# Patient Record
Sex: Female | Born: 1965 | Race: White | Hispanic: No | Marital: Married
Health system: Southern US, Community
[De-identification: ages and names within clinical notes are randomized; demographics above are authoritative.]

---

## 1998-01-01 ENCOUNTER — Other Ambulatory Visit: Admission: RE | Admit: 1998-01-01 | Discharge: 1998-01-01 | Payer: Self-pay | Admitting: Obstetrics and Gynecology

## 1999-01-03 ENCOUNTER — Ambulatory Visit (HOSPITAL_COMMUNITY): Admission: RE | Admit: 1999-01-03 | Discharge: 1999-01-03 | Payer: Self-pay | Admitting: Obstetrics and Gynecology

## 1999-01-13 ENCOUNTER — Other Ambulatory Visit: Admission: RE | Admit: 1999-01-13 | Discharge: 1999-01-13 | Payer: Self-pay | Admitting: Obstetrics and Gynecology

## 1999-11-24 ENCOUNTER — Other Ambulatory Visit: Admission: RE | Admit: 1999-11-24 | Discharge: 1999-11-24 | Payer: Self-pay | Admitting: Obstetrics and Gynecology

## 2000-09-28 ENCOUNTER — Other Ambulatory Visit: Admission: RE | Admit: 2000-09-28 | Discharge: 2000-09-28 | Payer: Self-pay | Admitting: Obstetrics and Gynecology

## 2001-02-02 ENCOUNTER — Other Ambulatory Visit: Admission: RE | Admit: 2001-02-02 | Discharge: 2001-02-02 | Payer: Self-pay | Admitting: Obstetrics and Gynecology

## 2002-05-28 ENCOUNTER — Other Ambulatory Visit: Admission: RE | Admit: 2002-05-28 | Discharge: 2002-05-28 | Payer: Self-pay | Admitting: Obstetrics and Gynecology

## 2003-06-04 ENCOUNTER — Other Ambulatory Visit: Admission: RE | Admit: 2003-06-04 | Discharge: 2003-06-04 | Payer: Self-pay | Admitting: Obstetrics and Gynecology

## 2004-08-28 ENCOUNTER — Other Ambulatory Visit: Admission: RE | Admit: 2004-08-28 | Discharge: 2004-08-28 | Payer: Self-pay | Admitting: Obstetrics and Gynecology

## 2013-07-31 ENCOUNTER — Other Ambulatory Visit: Payer: Self-pay | Admitting: Obstetrics and Gynecology

## 2013-07-31 DIAGNOSIS — R928 Other abnormal and inconclusive findings on diagnostic imaging of breast: Secondary | ICD-10-CM

## 2013-08-10 ENCOUNTER — Other Ambulatory Visit: Payer: Self-pay

## 2013-08-30 ENCOUNTER — Ambulatory Visit
Admission: RE | Admit: 2013-08-30 | Discharge: 2013-08-30 | Disposition: A | Discharge status: Home / Self Care | Payer: BC Managed Care – PPO | Source: Ambulatory Visit | Attending: Obstetrics and Gynecology | Admitting: Obstetrics and Gynecology

## 2013-08-30 DIAGNOSIS — R928 Other abnormal and inconclusive findings on diagnostic imaging of breast: Secondary | ICD-10-CM

## 2014-11-12 ENCOUNTER — Other Ambulatory Visit: Payer: Self-pay | Admitting: Obstetrics and Gynecology

## 2014-11-12 DIAGNOSIS — R928 Other abnormal and inconclusive findings on diagnostic imaging of breast: Secondary | ICD-10-CM

## 2014-11-18 ENCOUNTER — Other Ambulatory Visit: Payer: Self-pay | Admitting: Obstetrics and Gynecology

## 2014-11-18 ENCOUNTER — Ambulatory Visit
Admission: RE | Admit: 2014-11-18 | Discharge: 2014-11-18 | Disposition: A | Discharge status: Home / Self Care | Payer: BC Managed Care – PPO | Source: Ambulatory Visit | Attending: Obstetrics and Gynecology | Admitting: Obstetrics and Gynecology

## 2014-11-18 DIAGNOSIS — R928 Other abnormal and inconclusive findings on diagnostic imaging of breast: Secondary | ICD-10-CM

## 2014-11-26 ENCOUNTER — Ambulatory Visit
Admission: RE | Admit: 2014-11-26 | Discharge: 2014-11-26 | Disposition: A | Discharge status: Home / Self Care | Payer: BC Managed Care – PPO | Source: Ambulatory Visit | Attending: Obstetrics and Gynecology | Admitting: Obstetrics and Gynecology

## 2014-11-26 ENCOUNTER — Other Ambulatory Visit: Payer: Self-pay | Admitting: Obstetrics and Gynecology

## 2014-11-26 DIAGNOSIS — R928 Other abnormal and inconclusive findings on diagnostic imaging of breast: Secondary | ICD-10-CM

## 2014-11-26 DIAGNOSIS — R921 Mammographic calcification found on diagnostic imaging of breast: Secondary | ICD-10-CM

## 2014-11-26 HISTORY — PX: BREAST BIOPSY: SHX20

## 2021-07-09 ENCOUNTER — Other Ambulatory Visit: Payer: Self-pay | Admitting: Obstetrics and Gynecology

## 2021-07-09 DIAGNOSIS — N6320 Unspecified lump in the left breast, unspecified quadrant: Secondary | ICD-10-CM

## 2021-07-29 ENCOUNTER — Ambulatory Visit
Admission: RE | Admit: 2021-07-29 | Discharge: 2021-07-29 | Disposition: A | Discharge status: Home / Self Care | Payer: 59 | Source: Ambulatory Visit | Attending: Obstetrics and Gynecology | Admitting: Obstetrics and Gynecology

## 2021-07-29 DIAGNOSIS — N6320 Unspecified lump in the left breast, unspecified quadrant: Secondary | ICD-10-CM

## 2023-01-11 IMAGING — MG DIGITAL DIAGNOSTIC BILAT W/ TOMO W/ CAD
6 of 10 series · 6 of 30 positions shown · non-contrast
Comparison: Previous exam(s).

CLINICAL DATA: Palpable lump within the upper LEFT breast. History
of benign RIGHT breast stereotactic biopsy in 3851 with pathology
result of benign fibrocystic change and pseudoangiomatous stromal
hyperplasia.

EXAM:
DIGITAL DIAGNOSTIC BILATERAL MAMMOGRAM WITH TOMOSYNTHESIS AND CAD;
ULTRASOUND LEFT BREAST LIMITED
TECHNIQUE: Bilateral digital diagnostic mammography and breast tomosynthesis
was performed. The images were evaluated with computer-aided
detection.; Targeted ultrasound examination of the left breast was
performed.

[L TAN synth-2D]
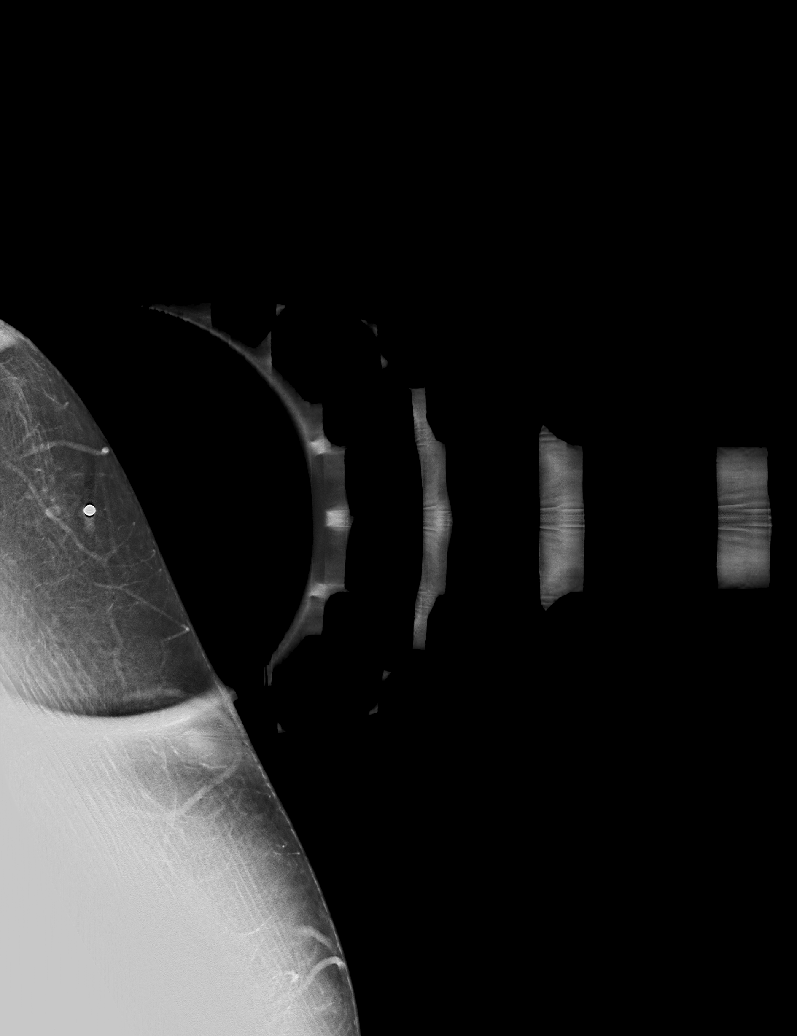

[L MLO synth-2D]
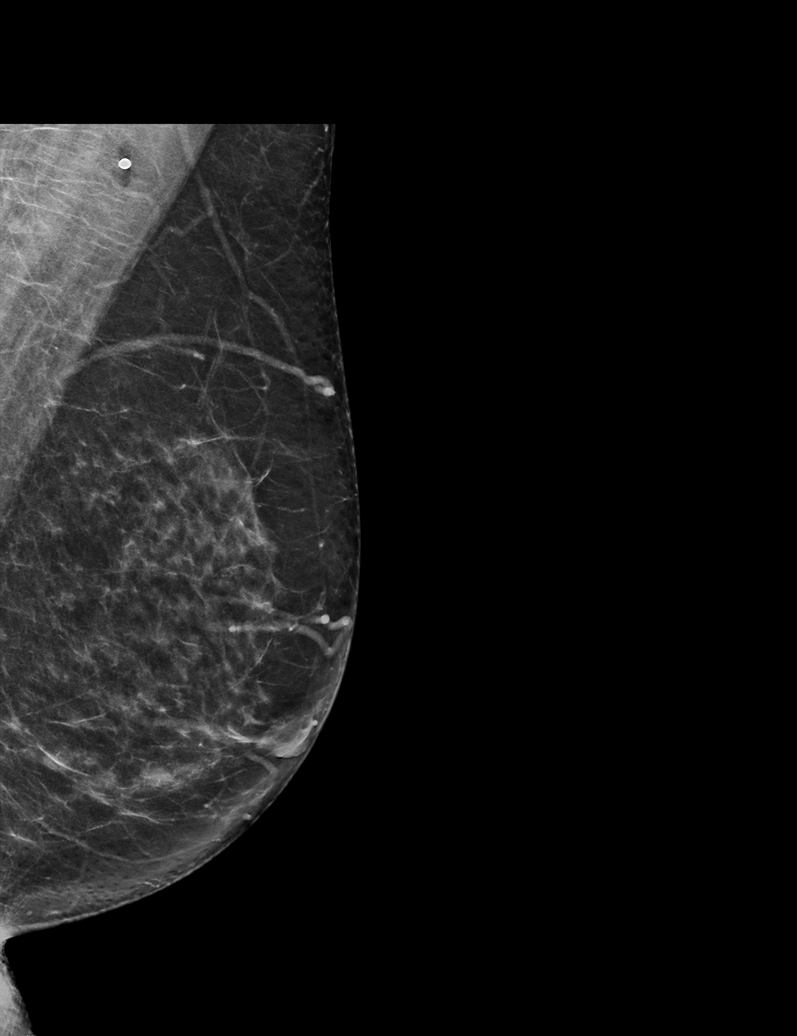

[R MLO synth-2D]
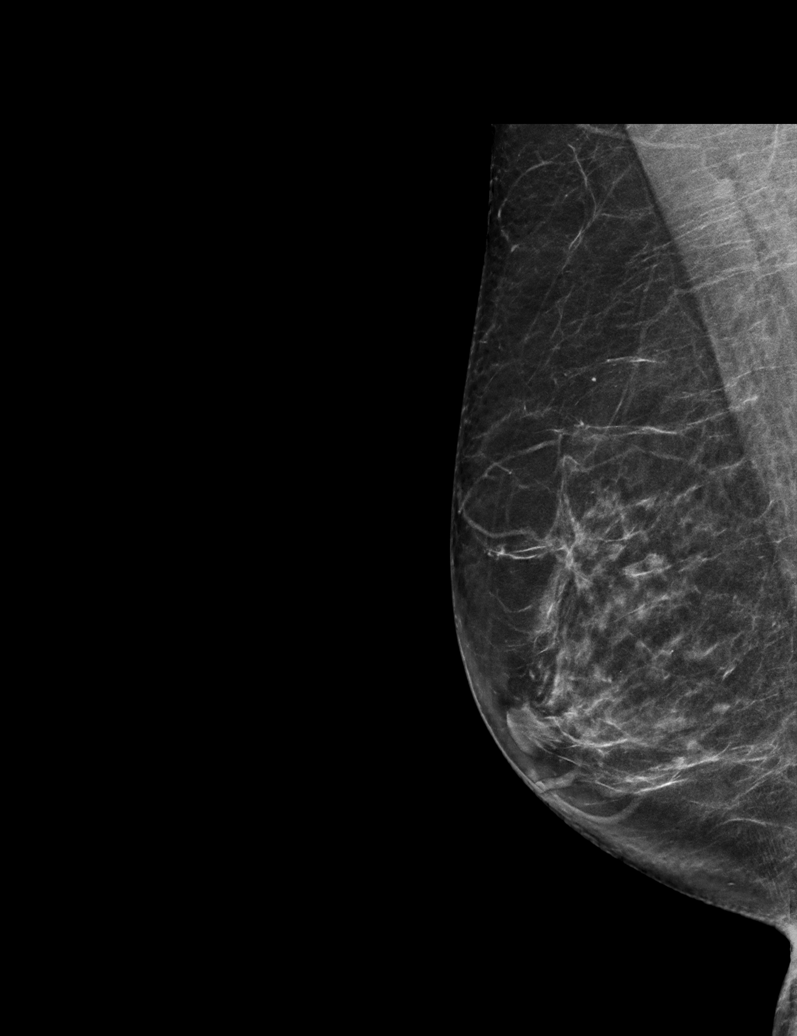

[R CC synth-2D]
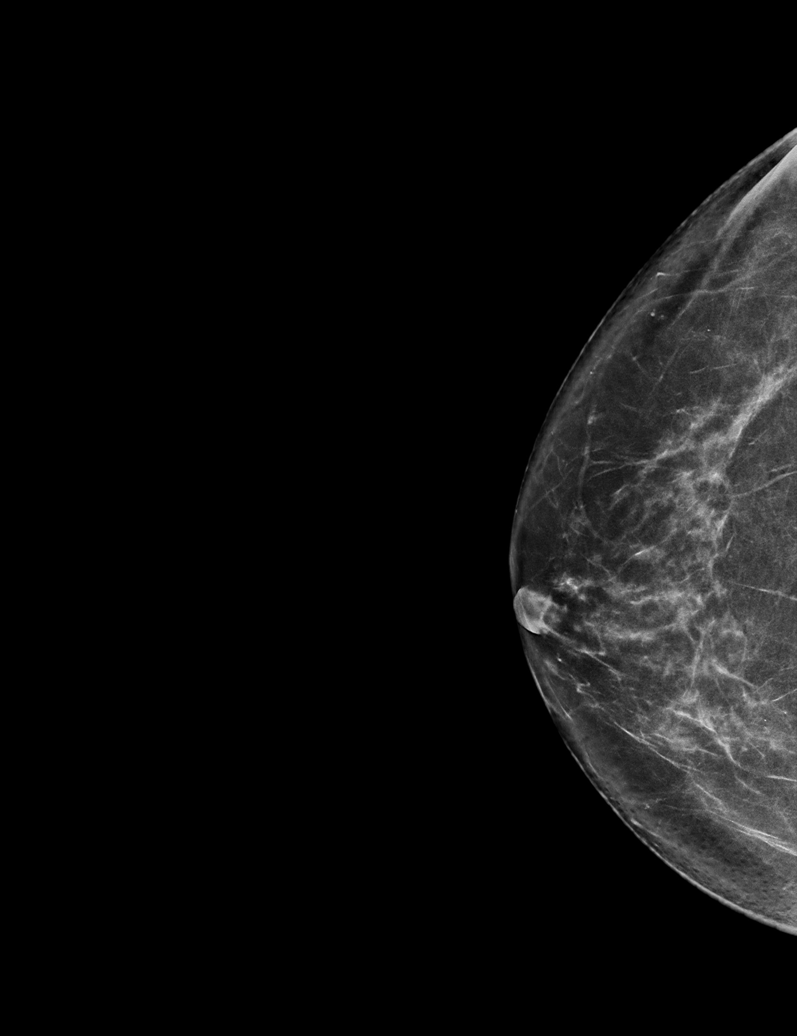

[L CC synth-2D]
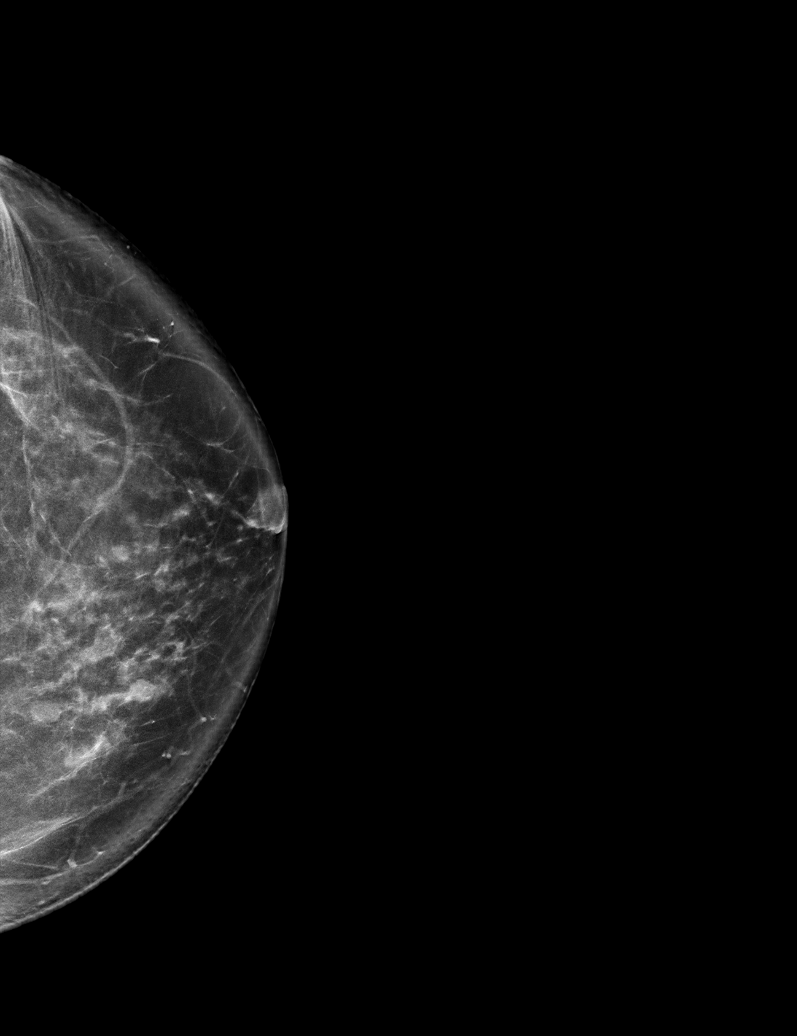

[L TAN tomo · tomo slice 23/44.0]
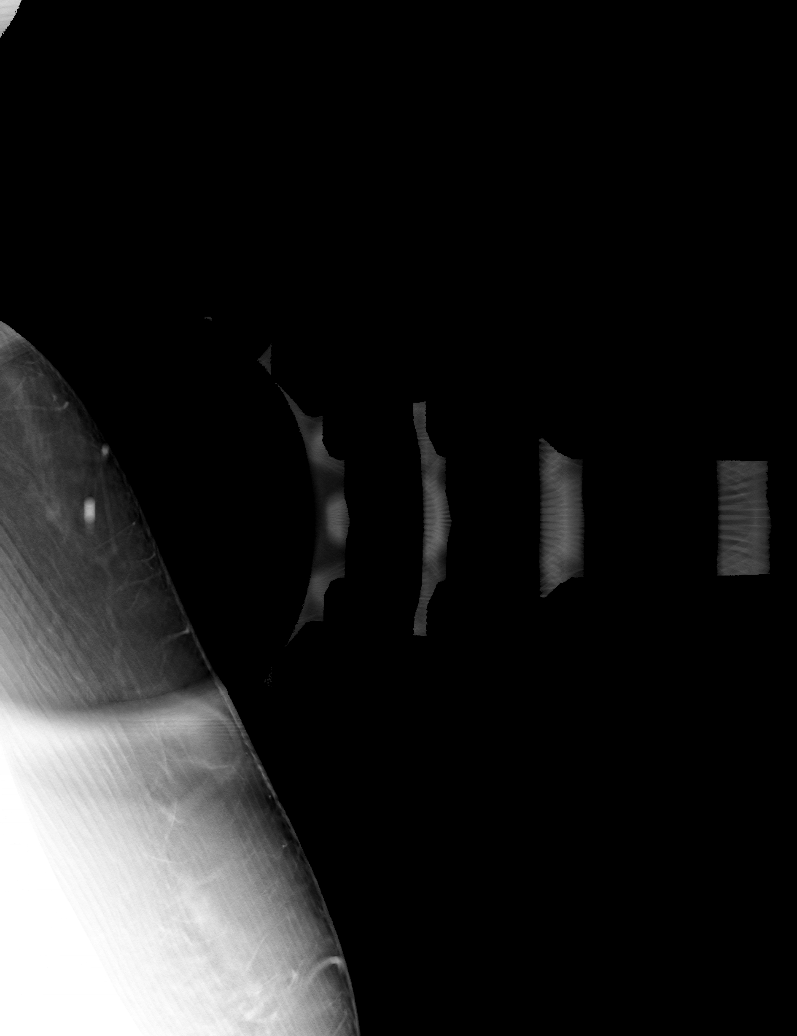

[6 of 30 positions shown; findings below may reference images not displayed]

ACR Breast Density Category c: The breast tissue is heterogeneously
dense, which may obscure small masses.
FINDINGS: There are oval circumscribed masses within the lower inner quadrant
of the LEFT breast, corresponding as incidental findings, largest
measuring approximately 6 mm.

There are no new dominant masses, suspicious calcifications or
secondary signs of malignancy elsewhere within the LEFT breast.
Specifically, there is no mammographic abnormality within the upper
LEFT breast corresponding to the palpable area of concern with
overlying skin marker in place.

There are no new dominant masses, suspicious calcifications or
secondary signs of malignancy within the RIGHT breast

Targeted ultrasound is performed, evaluating the upper LEFT breast
as directed by the patient, showing a small sebaceous cyst/epidermal
inclusion cyst at the undersurface of the skin at the 12 o'clock
axis, 13 cm from the nipple, measuring 2 mm, corresponding to the
palpable area of concern.

There are additional benign cysts in the LEFT breast at the 8:30
o'clock axis, 2 cm from the nipple, largest measuring 6 mm,
corresponding to the incidental mammographic finding.
IMPRESSION: 1. No evidence of malignancy within either breast.
2. Small benign sebaceous cyst/epidermal inclusion cyst at the 12
o'clock axis, measuring 2 mm, corresponding to the palpable area of
concern.
3. Additional benign cysts in the LEFT breast at the 8:30 o'clock
axis, largest measuring 6 mm, corresponding as incidental findings.

RECOMMENDATION:
1.  Screening mammogram in one year.(Code:HC-[id number])
2. The patient was instructed to return sooner if the area that she
feels becomes larger and/or firmer to palpation, or if a new
palpable abnormality is identified in either breast.

I have discussed the findings and recommendations with the patient.
If applicable, a reminder letter will be sent to the patient
regarding the next appointment.

BI-RADS CATEGORY  2: Benign.

## 2023-10-10 ENCOUNTER — Other Ambulatory Visit: Payer: Self-pay | Admitting: Obstetrics and Gynecology

## 2023-10-10 DIAGNOSIS — R928 Other abnormal and inconclusive findings on diagnostic imaging of breast: Secondary | ICD-10-CM

## 2023-10-19 ENCOUNTER — Ambulatory Visit
Admission: RE | Admit: 2023-10-19 | Discharge: 2023-10-19 | Disposition: A | Discharge status: Home / Self Care | Source: Ambulatory Visit | Attending: Obstetrics and Gynecology | Admitting: Obstetrics and Gynecology

## 2023-10-19 DIAGNOSIS — R928 Other abnormal and inconclusive findings on diagnostic imaging of breast: Secondary | ICD-10-CM
